# Patient Record
Sex: Male | Born: 1965 | Race: White | Hispanic: No | Smoking: Never smoker
Health system: Southern US, Community
[De-identification: ages and names within clinical notes are randomized; demographics above are authoritative.]

## PROBLEM LIST (undated history)

## (undated) DIAGNOSIS — M1990 Unspecified osteoarthritis, unspecified site: Secondary | ICD-10-CM

## (undated) HISTORY — PX: LEG SURGERY: SHX1003

## (undated) HISTORY — PX: TONSILLECTOMY: SUR1361

---

## 2015-04-08 ENCOUNTER — Emergency Department (HOSPITAL_COMMUNITY)
Admission: EM | Admit: 2015-04-08 | Discharge: 2015-04-08 | Disposition: A | Discharge status: Home / Self Care | Payer: Worker's Compensation | Attending: Emergency Medicine | Admitting: Emergency Medicine

## 2015-04-08 ENCOUNTER — Emergency Department (HOSPITAL_COMMUNITY): Payer: Worker's Compensation

## 2015-04-08 ENCOUNTER — Encounter (HOSPITAL_COMMUNITY): Payer: Self-pay | Admitting: Emergency Medicine

## 2015-04-08 DIAGNOSIS — Y9389 Activity, other specified: Secondary | ICD-10-CM | POA: Insufficient documentation

## 2015-04-08 DIAGNOSIS — S59912A Unspecified injury of left forearm, initial encounter: Secondary | ICD-10-CM | POA: Diagnosis present

## 2015-04-08 DIAGNOSIS — W208XXA Other cause of strike by thrown, projected or falling object, initial encounter: Secondary | ICD-10-CM | POA: Insufficient documentation

## 2015-04-08 DIAGNOSIS — Z880 Allergy status to penicillin: Secondary | ICD-10-CM | POA: Diagnosis not present

## 2015-04-08 DIAGNOSIS — Z8739 Personal history of other diseases of the musculoskeletal system and connective tissue: Secondary | ICD-10-CM | POA: Insufficient documentation

## 2015-04-08 DIAGNOSIS — Y9289 Other specified places as the place of occurrence of the external cause: Secondary | ICD-10-CM | POA: Insufficient documentation

## 2015-04-08 DIAGNOSIS — S50812A Abrasion of left forearm, initial encounter: Secondary | ICD-10-CM | POA: Insufficient documentation

## 2015-04-08 DIAGNOSIS — S5012XA Contusion of left forearm, initial encounter: Secondary | ICD-10-CM | POA: Insufficient documentation

## 2015-04-08 DIAGNOSIS — Y990 Civilian activity done for income or pay: Secondary | ICD-10-CM | POA: Diagnosis not present

## 2015-04-08 HISTORY — DX: Unspecified osteoarthritis, unspecified site: M19.90

## 2015-04-08 MED ORDER — HYDROCODONE-ACETAMINOPHEN 5-325 MG PO TABS: 1.0000 | ORAL_TABLET | ORAL | Status: AC | PRN

## 2015-04-08 MED ORDER — IBUPROFEN 800 MG PO TABS: 800.0000 mg | ORAL_TABLET | Freq: Three times a day (TID) | ORAL | Status: AC

## 2015-04-08 NOTE — Discharge Instructions (Signed)
Cryotherapy °Cryotherapy means treatment with cold. Ice or gel packs can be used to reduce both pain and swelling. Ice is the most helpful within the first 24 to 48 hours after an injury or flare-up from overusing a muscle or joint. Sprains, strains, spasms, burning pain, shooting pain, and aches can all be eased with ice. Ice can also be used when recovering from surgery. Ice is effective, has very few side effects, and is safe for most people to use. °PRECAUTIONS  °Ice is not a safe treatment option for people with: °· Raynaud phenomenon. This is a condition affecting small blood vessels in the extremities. Exposure to cold may cause your problems to return. °· Cold hypersensitivity. There are many forms of cold hypersensitivity, including: °¨ Cold urticaria. Red, itchy hives appear on the skin when the tissues begin to warm after being iced. °¨ Cold erythema. This is a red, itchy rash caused by exposure to cold. °¨ Cold hemoglobinuria. Red blood cells break down when the tissues begin to warm after being iced. The hemoglobin that carry oxygen are passed into the urine because they cannot combine with blood proteins fast enough. °· Numbness or altered sensitivity in the area being iced. °If you have any of the following conditions, do not use ice until you have discussed cryotherapy with your caregiver: °· Heart conditions, such as arrhythmia, angina, or chronic heart disease. °· High blood pressure. °· Healing wounds or open skin in the area being iced. °· Current infections. °· Rheumatoid arthritis. °· Poor circulation. °· Diabetes. °Ice slows the blood flow in the region it is applied. This is beneficial when trying to stop inflamed tissues from spreading irritating chemicals to surrounding tissues. However, if you expose your skin to cold temperatures for too long or without the proper protection, you can damage your skin or nerves. Watch for signs of skin damage due to cold. °HOME CARE INSTRUCTIONS °Follow  these tips to use ice and cold packs safely. °· Place a dry or damp towel between the ice and skin. A damp towel will cool the skin more quickly, so you may need to shorten the time that the ice is used. °· For a more rapid response, add gentle compression to the ice. °· Ice for no more than 10 to 20 minutes at a time. The bonier the area you are icing, the less time it will take to get the benefits of ice. °· Check your skin after 5 minutes to make sure there are no signs of a poor response to cold or skin damage. °· Rest 20 minutes or more between uses. °· Once your skin is numb, you can end your treatment. You can test numbness by very lightly touching your skin. The touch should be so light that you do not see the skin dimple from the pressure of your fingertip. When using ice, most people will feel these normal sensations in this order: cold, burning, aching, and numbness. °· Do not use ice on someone who cannot communicate their responses to pain, such as small children or people with dementia. °HOW TO MAKE AN ICE PACK °Ice packs are the most common way to use ice therapy. Other methods include ice massage, ice baths, and cryosprays. Muscle creams that cause a cold, tingly feeling do not offer the same benefits that ice offers and should not be used as a substitute unless recommended by your caregiver. °To make an ice pack, do one of the following: °· Place crushed ice or a   bag of frozen vegetables in a sealable plastic bag. Squeeze out the excess air. Place this bag inside another plastic bag. Slide the bag into a pillowcase or place a damp towel between your skin and the bag. °· Mix 3 parts water with 1 part rubbing alcohol. Freeze the mixture in a sealable plastic bag. When you remove the mixture from the freezer, it will be slushy. Squeeze out the excess air. Place this bag inside another plastic bag. Slide the bag into a pillowcase or place a damp towel between your skin and the bag. °SEEK MEDICAL CARE  IF: °· You develop white spots on your skin. This may give the skin a blotchy (mottled) appearance. °· Your skin turns blue or pale. °· Your skin becomes waxy or hard. °· Your swelling gets worse. °MAKE SURE YOU:  °· Understand these instructions. °· Will watch your condition. °· Will get help right away if you are not doing well or get worse. °  °This information is not intended to replace advice given to you by your health care provider. Make sure you discuss any questions you have with your health care provider. °  °Document Released: 10/18/2010 Document Revised: 03/14/2014 Document Reviewed: 10/18/2010 °Elsevier Interactive Patient Education ©2016 [employer] °Hematoma °A hematoma is a collection of blood under the skin, in an organ, in a body space, in a joint space, or in other tissue. The blood can clot to form a lump that you can see and feel. The lump is often firm and may sometimes become sore and tender. Most hematomas get better in a few days to weeks. However, some hematomas may be serious and require medical care. Hematomas can range in size from very small to very large. °CAUSES  °A hematoma can be caused by a blunt or penetrating injury. It can also be caused by spontaneous leakage from a blood vessel under the skin. Spontaneous leakage from a blood vessel is more likely to occur in older people, especially those taking blood thinners. Sometimes, a hematoma can develop after certain medical procedures. °SIGNS AND SYMPTOMS  °· A firm lump on the body. °· Possible pain and tenderness in the area. °· Bruising. Blue, dark blue, purple-red, or yellowish skin may appear at the site of the hematoma if the hematoma is close to the surface of the skin. °For hematomas in deeper tissues or body spaces, the signs and symptoms may be subtle. For example, an intra-abdominal hematoma may cause abdominal pain, weakness, fainting, and shortness of breath. An intracranial hematoma may cause a headache or symptoms  such as weakness, trouble speaking, or a change in consciousness. °DIAGNOSIS  °A hematoma can usually be diagnosed based on your medical history and a physical exam. Imaging tests may be needed if your health care provider suspects a hematoma in deeper tissues or body spaces, such as the abdomen, head, or chest. These tests may include ultrasonography or a CT scan.  °TREATMENT  °Hematomas usually go away on their own over time. Rarely does the blood need to be drained out of the body. Large hematomas or those that may affect vital organs will sometimes need surgical drainage or monitoring. °HOME CARE INSTRUCTIONS  °· Apply ice to the injured area:   °· Put ice in a plastic bag.   °· Place a towel between your skin and the bag.   °· Leave the ice on for 20 minutes, 2-3 times a day for the first 1 to 2 days.   °· After the first 2 days, switch   to using warm compresses on the hematoma.   °· Elevate the injured area to help decrease pain and swelling. Wrapping the area with an elastic bandage may also be helpful. Compression helps to reduce swelling and promotes shrinking of the hematoma. Make sure the bandage is not wrapped too tight.   °· If your hematoma is on a lower extremity and is painful, crutches may be helpful for a couple days.   °· Only take over-the-counter or prescription medicines as directed by your health care provider. °SEEK IMMEDIATE MEDICAL CARE IF:  °· You have increasing pain, or your pain is not controlled with medicine.   °· You have a fever.   °· You have worsening swelling or discoloration.   °· Your skin over the hematoma breaks or starts bleeding.   °· Your hematoma is in your chest or abdomen and you have weakness, shortness of breath, or a change in consciousness. °· Your hematoma is on your scalp (caused by a fall or injury) and you have a worsening headache or a change in alertness or consciousness. °MAKE SURE YOU:  °· Understand these instructions. °· Will watch your condition. °· Will  get help right away if you are not doing well or get worse. °  °This information is not intended to replace advice given to you by your health care provider. Make sure you discuss any questions you have with your health care provider. °  °Document Released: 10/06/2003 Document Revised: 10/24/2012 Document Reviewed: 08/01/2012 °Elsevier Interactive Patient Education ©2016 [employer] ° °

## 2015-04-08 NOTE — ED Notes (Signed)
Patient transported to X-ray 

## 2015-04-08 NOTE — ED Provider Notes (Signed)
CSN: [id number]     Arrival date & time 04/08/15  2144 History  By signing my name below, I, Evon Slack, attest that this documentation has been prepared under the direction and in the presence of [employer], PA-C. Electronically Signed: Evon Slack, ED Scribe. 04/08/2015. 10:45 PM.    Chief Complaint  Patient presents with  . Arm Injury    The history is provided by the patient. No language interpreter was used.   HPI Comments: Blake Kelley is a 50 y.o. male who presents to the Emergency Department complaining of left arm injury onset tonight PTA. Pt states that he dropped a steel plate onto his left forearm. Pt presents with associated swelling to the left forearm and abrasion. Pt states that he is right hand dominant. Pt doesn't report any medications PTA. Pt denies numbness. Pt states that his tetanus is UTD.   Past Medical History  Diagnosis Date  . Arthritis    Past Surgical History  Procedure Laterality Date  . Leg surgery    . Tonsillectomy     History reviewed. No pertinent family history. Social History  Substance Use Topics  . Smoking status: Never Smoker   . Smokeless tobacco: Current User    Types: Chew  . Alcohol Use: 14.4 oz/week    24 Cans of beer per week    Review of Systems  Musculoskeletal: Positive for joint swelling and arthralgias.  Skin: Positive for wound.  Neurological: Negative for numbness.  All other systems reviewed and are negative.    Allergies  Penicillins  Home Medications   Prior to Admission medications   Not on File   BP 151/110 mmHg  Pulse 75  Temp(Src) 97.7 F (36.5 C) (Oral)  Resp 16  Ht  (1.727 m)  Wt 200 lb (90.719 kg)  BMI 30.42 kg/m2  SpO2 98%   Physical Exam  Constitutional: He is oriented to person, place, and time. He appears well-developed and well-nourished. No distress.  HENT:  Head: Normocephalic and atraumatic.  Eyes: Conjunctivae and EOM are normal.  Neck: Neck supple. No tracheal  deviation present.  Cardiovascular: Normal rate.   Pulmonary/Chest: Effort normal. No respiratory distress.  Musculoskeletal: Normal range of motion.  left forearm large hematoma volar aspect associated with a superficial abrasion that extends the length of the forearm. Distal pulses intact, hand and wrist non tender, NVI.   Neurological: He is alert and oriented to person, place, and time.  Skin: Skin is warm and dry.  Psychiatric: He has a normal mood and affect. His behavior is normal.  Nursing note and vitals reviewed.   ED Course  Procedures (including critical care time) DIAGNOSTIC STUDIES: Oxygen Saturation is 98% on RA, normal by my interpretation.    COORDINATION OF CARE: 10:45 PM-Discussed treatment plan with pt at bedside and pt agreed to plan.     Labs Review Labs Reviewed - No data to display  Imaging Review Dg Forearm Left  04/08/2015  CLINICAL DATA:  A machine fell on patient's arm at his work this pm, bruising and laceration anterior surface mid shaft of forearm, hand pain and swelling 5th metacarpal area. EXAM: LEFT FOREARM - 2 VIEW COMPARISON:  None. FINDINGS: There is no evidence of fracture or other focal bone lesions. Alignment is maintained. Soft tissue edema about the volar forearm without radiopaque foreign body. IMPRESSION: Soft tissue edema without acute osseous abnormality. No radiopaque foreign body. Electronically Signed   By: Rubye Oaks M.D.   On: 04/08/2015  22:36   Dg Hand Complete Left  04/08/2015  CLINICAL DATA:  Machine fell on the arm at work with fifth metacarpal area pain. Initial encounter. EXAM: LEFT HAND - COMPLETE 3+ VIEW COMPARISON:  None. FINDINGS: Limited steep oblique radiograph. No evidence of acute fracture or subluxation. No acute soft tissue finding. IMPRESSION: Negative. Electronically Signed   By: Marnee Spring M.D.   On: 04/08/2015 22:35   I have personally reviewed and evaluated these images as part of my medical  decision-making.   EKG Interpretation None      MDM   Final diagnoses:  None   1. Left forearm hematoma  Imaging negative for bony injury to left forearm. Able to move all digits of hand and wrist. Injury required supportive care only.  Drug testing per employer policy performed.    I personally performed the services described in this documentation, which was scribed in my presence. The recorded information has been reviewed and is accurate.       Elpidio Anis, PA-C 04/09/15 0640  Melene Plan, DO 04/09/15 [phone]

## 2015-04-08 NOTE — ED Notes (Signed)
Pt from work states he lost control of a 100 lb steel plate and it scraped his left arm. Pt has abrasion and small superficial laceration to arm, but has no obvious deformity. Pt also has complaint of tingling in his arm and states he has pain in the left shoulder as well.

## 2017-09-17 IMAGING — CR DG FOREARM 2V*L*
2 series · 2 of 2 positions shown · non-contrast
Comparison: None.

CLINICAL DATA: A machine fell on patient's arm at his work this pm,
bruising and laceration anterior surface mid shaft of forearm, hand
pain and swelling 5th metacarpal area.

EXAM:
LEFT FOREARM - 2 VIEW

[x forearm ap left]
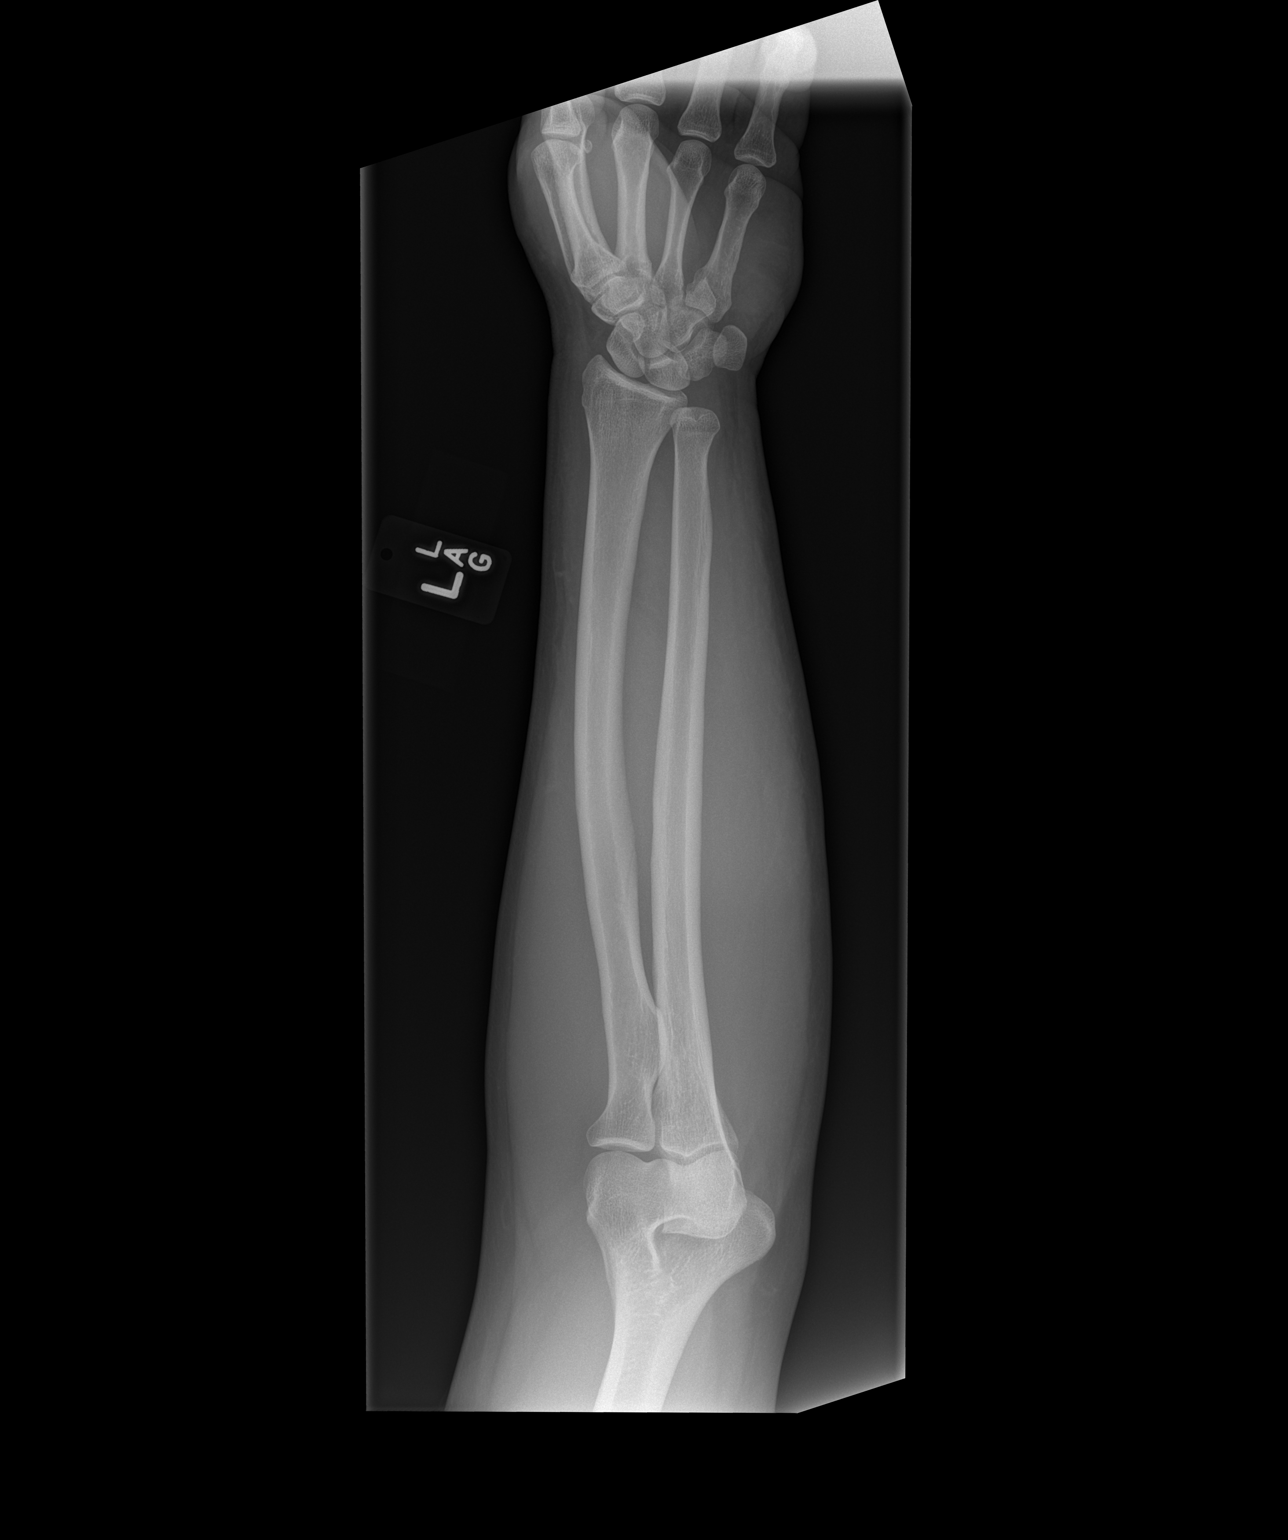

[x forearm lat left]
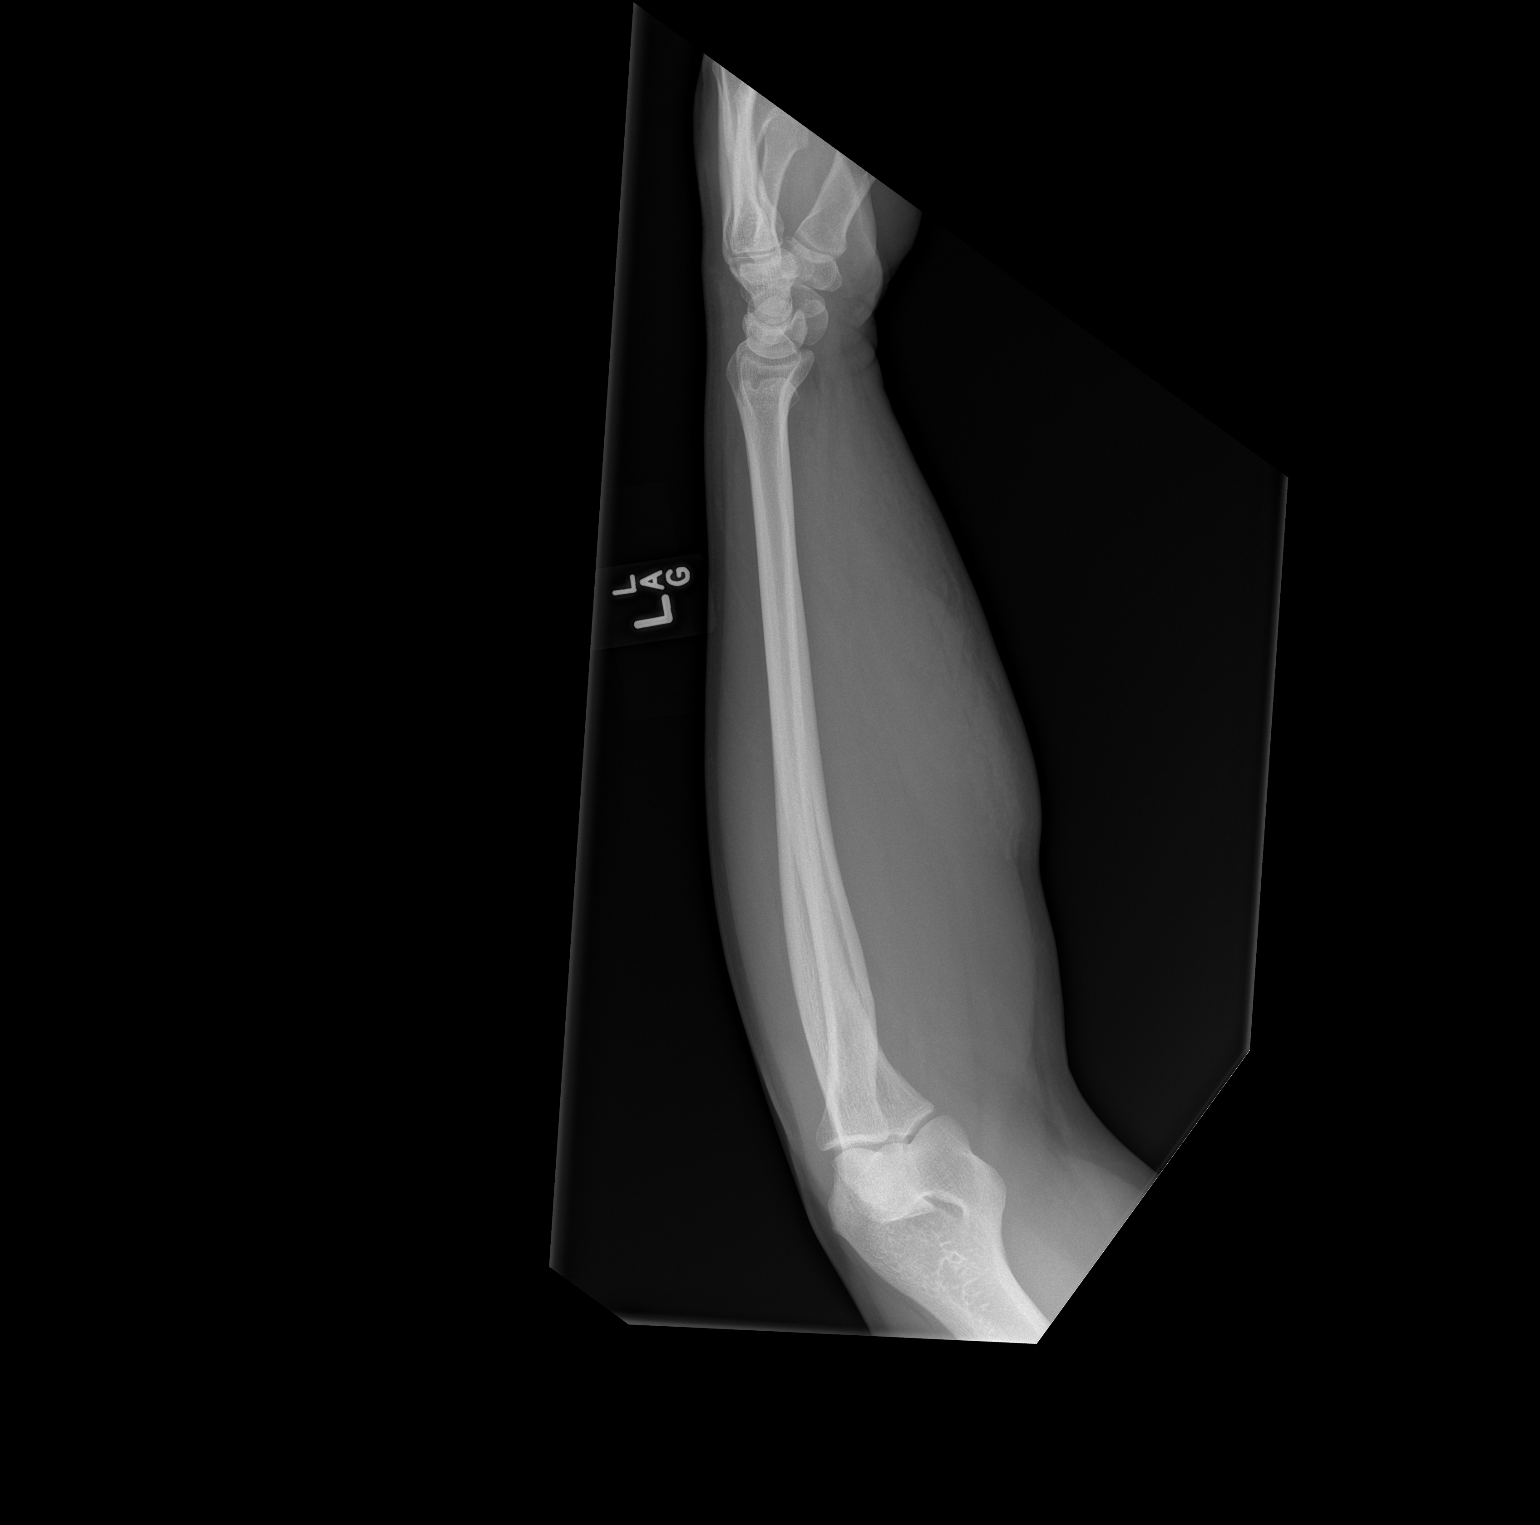

[2 of 2 positions shown; findings below may reference images not displayed]

FINDINGS: There is no evidence of fracture or other focal bone lesions.
Alignment is maintained. Soft tissue edema about the volar forearm
without radiopaque foreign body.
IMPRESSION: Soft tissue edema without acute osseous abnormality. No radiopaque
foreign body.
